# Patient Record
Sex: Female | Born: 2000
Health system: Southern US, Community
[De-identification: ages and names within clinical notes are randomized; demographics above are authoritative.]

## PROBLEM LIST (undated history)

## (undated) DIAGNOSIS — R109 Unspecified abdominal pain: Secondary | ICD-10-CM

## (undated) DIAGNOSIS — K5900 Constipation, unspecified: Secondary | ICD-10-CM

## (undated) HISTORY — PX: FRACTURE SURGERY: SHX138

## (undated) HISTORY — DX: Unspecified abdominal pain: R10.9

## (undated) HISTORY — DX: Constipation, unspecified: K59.00

---
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
## 2022-01-26 ENCOUNTER — Ambulatory Visit: Payer: Self-pay

## 2022-01-26 NOTE — Telephone Encounter (Signed)
  Chief Complaint: depression Symptoms: depression and out of medication for 2 months  Frequency: 2 months or more Pertinent Negatives: Patient denies SI or HI Disposition: [] ED /[x] Urgent Care (no appt availability in office) / [] Appointment(In office/virtual)/ []  [facility]/ [] Home Care/ [] Refused Recommended Disposition /[] [facility]/ []  Follow-up with PCP Additional Notes: pt's mother calling to see what she can do about a referral for [facility] d/t pt not having PCP anymore and pt has increased depression sx since being out of meds for 2 months. She states she has tried several different locations but all needed referrals. Offered [facility] but too far out of the way from pt's location. Recommended mom try [facility] services in [place] and provider phone # and location details. She verbalized understanding and states she will call.   Reason for Disposition  [1] Depression AND [2] worsening (e.g., sleeping poorly, less able to do activities of daily living)  Answer Assessment - Initial Assessment Questions 1. CONCERN: "What happened that made you call today?"     Depressed and not had medicine in 2 months  3. RISK OF HARM - SUICIDAL IDEATION:  "Do you ever have thoughts of hurting or killing yourself?"  (e.g., yes, no, no but preoccupation with thoughts about death)   - INTENT:  "Do you have thoughts of hurting or killing yourself right NOW?" (e.g., yes, no, N/A)   - PLAN: "Do you have a specific plan for how you would do this?" (e.g., gun, knife, overdose, no plan, N/A)     no 4. RISK OF HARM - HOMICIDAL IDEATION:  "Do you ever have thoughts of hurting or killing someone else?"  (e.g., yes, no, no but preoccupation with thoughts about death)   - INTENT:  "Do you have thoughts of hurting or killing someone right NOW?" (e.g., yes, no, N/A)   - PLAN: "Do you have a specific plan for how you would do this?" (e.g., gun, knife, no plan, N/A)      no 5. FUNCTIONAL IMPAIRMENT: "How have  things been going for you overall? Have you had more difficulty than usual doing your normal daily activities?"  (e.g., better, same, worse; self-care, school, work, interactions)     yes 6. SUPPORT: "Who is with you now?" "Who do you live with?" "Do you have family or friends who you can talk to?"      Mother and family  8. STRESSORS: "Has there been any new stress or recent changes in your life?"  Protocols used: Depression-A-AH

## 2022-04-22 NOTE — Progress Notes (Deleted)
New patient visit   Patient: Tammy Reyes   DOB: [birth year]   22 y.o. Female  MRN: [id number] Visit Date: 04/23/2022  Today's healthcare provider: Mikey Kirschner, PA-C   No chief complaint on file.  Subjective    Tammy Reyes is a 22 y.o. female who presents today as a new patient to establish care.  HPI  ***  Past Medical History:  Diagnosis Date   Abdominal pain    Constipation    Past Surgical History:  Procedure Laterality Date   FRACTURE SURGERY Right    2008   Family Status  Relation Name Status   Mother  Alive   Father  Alive   Sister  Alive   Brother  Alive   MGM  Alive   MGF  Alive   PGM  Alive   PGF  Alive   Family History  Problem Relation Age of Onset   Mental illness Mother    Mental illness Maternal Grandmother    Cancer Maternal Grandfather        prostate   Social History   Socioeconomic History   Marital status: Single    Spouse name: Not on file   Number of children: Not on file   Years of education: Not on file   Highest education level: Not on file  Occupational History   Not on file  Tobacco Use   Smoking status: Never   Smokeless tobacco: Never  Vaping Use   Vaping Use: Never used  Substance and Sexual Activity   Alcohol use: No   Drug use: No   Sexual activity: Not Currently  Other Topics Concern   Not on file  Social History Narrative   ** Merged History Encounter **       Social Determinants of Health   Financial Resource Strain: Not on file  Food Insecurity: Not on file  Transportation Needs: Not on file  Physical Activity: Not on file  Stress: Not on file  Social Connections: Not on file   Outpatient Medications Prior to Visit  Medication Sig   FLUoxetine (PROZAC) 20 MG capsule Take 1 pill every other day for 1-2 weeks then increase to 1 tab daily   hydrOXYzine (VISTARIL) 25 MG capsule Take 1 capsule (25 mg total) by mouth every 8 (eight) hours as needed. Do not drive on this medicine   Vitamin D,  Ergocalciferol, (DRISDOL) 1.25 MG (50000 UNIT) CAPS capsule Take 1 tab 2x a week (Monday and Thursday)   No facility-administered medications prior to visit.   No Known Allergies  Immunization History  Administered Date(s) Administered   DTaP 02/05/2003, 04/04/2003, 05/31/2003, 03/06/2004, 11/04/2005   HIB (PRP-OMP) 02/05/2003, 04/04/2003, 05/31/2003, 03/06/2004   HPV Bivalent 11/04/2015   Hepatitis A 11/05/2005, 11/09/2006   Hepatitis B 01/10/03, 09/05/2003, 03/06/2004   IPV 02/05/2003, 04/04/2003, 09/05/2003, 11/14/2007   Influenza,inj,Quad PF,6+ Mos 11/13/2015, 12/04/2018   Influenza-Unspecified 11/07/2014, 12/15/2020   MMR 12/03/2003, 11/14/2007   Meningococcal Conjugate 11/04/2015   Pneumococcal Conjugate-13 02/05/2003, 04/04/2003, 05/31/2003, 12/03/2003   Td 11/14/2007   Tdap 11/04/2015   Varicella 12/03/2003, 11/14/2007    Health Maintenance  Topic Date Due   COVID-19 Vaccine (1) Never done   HPV VACCINES (2 - 2-dose series) 05/05/2016   HIV Screening  Never done   Hepatitis C Screening  Never done   INFLUENZA VACCINE  10/06/2021   DTaP/Tdap/Td (7 - Td or Tdap) 11/03/2025    Patient Care Team: Pcp, No as PCP - General  Review of Systems  {Labs  Heme  Chem  Endocrine  Serology  Results Review (optional):23779}   Objective    There were no vitals taken for this visit. {Show previous vital signs (optional):23777}  Physical Exam ***  Depression Screen    07/22/2021   10:08 AM 04/16/2021    3:19 PM 03/31/2021    8:35 AM 11/06/2020    4:27 PM  PHQ 2/9 Scores  PHQ - 2 Score 5 6 3 5  $ PHQ- 9 Score 17 19 13 21   $ No results found for any visits on 04/23/22.  Assessment & Plan     ***  No follow-ups on file.     {provider attestation***:1}   Mikey Kirschner, PA-C  [place] [phone] (phone) [phone] (fax)  [place]

## 2022-04-23 ENCOUNTER — Ambulatory Visit: Payer: Medicaid Other | Admitting: Physician Assistant

## 2022-05-14 ENCOUNTER — Ambulatory Visit: Payer: Self-pay

## 2022-05-14 ENCOUNTER — Encounter: Payer: Medicaid Other | Admitting: Nurse Practitioner

## 2022-05-14 ENCOUNTER — Telehealth: Payer: Medicaid Other | Admitting: Nurse Practitioner

## 2022-05-14 DIAGNOSIS — F339 Major depressive disorder, recurrent, unspecified: Secondary | ICD-10-CM | POA: Diagnosis not present

## 2022-05-14 MED ORDER — FLUOXETINE HCL 20 MG PO CAPS
ORAL_CAPSULE | ORAL | 2 refills | Status: DC
Start: 2022-05-14 — End: 2022-12-27

## 2022-05-14 NOTE — Progress Notes (Unsigned)
Spoke with Mother, patient is unavailable during time of scheduled video visit. Requested reschedule at 4pm

## 2022-05-14 NOTE — Telephone Encounter (Signed)
  Chief Complaint: depression Symptoms: grieving over broken relationship with spouse, wanting to get back on antidepressants, isolation, sleeping more, eating less Frequency: chronic but was depressed prior to break up and now it is worse Pertinent Negatives: Patient denies suicidal thoughts or homicidal thoughts Disposition: [] ED /[] Urgent Care (no appt availability in office) / [] Appointment(In office/virtual)/ [x]  [facility]/ [] Home Care/ [] Refused Recommended Disposition /[] [facility]/ []  Follow-up with PCP Additional Notes: called [facility] and spoke with Iris who stated pt was dismisses. Made pt a Virtual UC Visit and sent pt resources in her area for beavioral health and suicide crisis line and crisis text line. Advised pt to go to ED if she starts having Suicidal thoughts or plan. Also messaged the address and phone number to [facility].  Reason for Disposition  [1] Depression AND [2] worsening (e.g., sleeping poorly, less able to do activities of daily living)  Answer Assessment - Initial Assessment Questions 1. CONCERN: "What happened that made you call today?"     Spouse  2. DEPRESSION SYMPTOM SCREENING: "How are you feeling overall?" (e.g., decreased energy, increased sleeping or difficulty sleeping, difficulty concentrating, feelings of sadness, guilt, hopelessness, or worthlessness)     Decreased activity, sadness, sleeping a lot , isolating, eating less,  3. RISK OF HARM - SUICIDAL IDEATION:  "Do you ever have thoughts of hurting or killing yourself?"  (e.g., yes, no, no but preoccupation with thoughts about death)   - INTENT:  "Do you have thoughts of hurting or killing yourself right NOW?" (e.g., yes, no, N/A)   - PLAN: "Do you have a specific plan for how you would do this?" (e.g., gun, knife, overdose, no plan, N/A)     no 4. RISK OF HARM - HOMICIDAL IDEATION:  "Do you ever have thoughts of hurting or killing someone else?"  (e.g., yes, no,  no but preoccupation with thoughts about death)   - INTENT:  "Do you have thoughts of hurting or killing someone right NOW?" (e.g., yes, no, N/A)   - PLAN: "Do you have a specific plan for how you would do this?" (e.g., gun, knife, no plan, N/A)      no 5. FUNCTIONAL IMPAIRMENT: "How have things been going for you overall? Have you had more difficulty than usual doing your normal daily activities?"  (e.g., better, same, worse; self-care, school, work, interactions)     Broke up few days ago depression was worse bad about self 6. SUPPORT: "Who is with you now?" "Who do you live with?" "Do you have family or friends who you can talk to?"      Lives with Mom, friends 7. THERAPIST: "Do you have a counselor or therapist? Name?"     No wanting one  8. STRESSORS: "Has there been any new stress or recent changes in your life?"     Breaking up some  9. ALCOHOL USE OR SUBSTANCE USE (DRUG USE): "Do you drink alcohol or use any illegal drugs?"     No  10. OTHER: "Do you have any other physical symptoms right now?" (e.g., fever)       Chest gets tight- comes with stress 11. PREGNANCY: "Is there any chance you are pregnant?" "When was your last menstrual period?"       N/a  Protocols used: Depression-A-AH

## 2022-05-14 NOTE — Patient Instructions (Signed)
New Patient Visit with Tammy Reyes Wednesday June 16, 2022 Arrive by 10:45 AM EDT Starts at 11:00 AM EDT (40 minutes) Add to calendar [facility] [id number] [place] [id number] [phone]

## 2022-05-14 NOTE — Progress Notes (Signed)
Virtual Visit Consent   Tammy Reyes, you are scheduled for a virtual visit with a [facility] provider today. Just as with appointments in the office, your consent must be obtained to participate. Your consent will be active for this visit and any virtual visit you may have with one of our providers in the next 365 days. If you have a MyChart account, a copy of this consent can be sent to you electronically.  As this is a virtual visit, video technology does not allow for your provider to perform a traditional examination. This may limit your provider's ability to fully assess your condition. If your provider identifies any concerns that need to be evaluated in person or the need to arrange testing (such as labs, EKG, etc.), we will make arrangements to do so. Although advances in technology are sophisticated, we cannot ensure that it will always work on either your end or our end. If the connection with a video visit is poor, the visit may have to be switched to a telephone visit. With either a video or telephone visit, we are not always able to ensure that we have a secure connection.  By engaging in this virtual visit, you consent to the provision of healthcare and authorize for your insurance to be billed (if applicable) for the services provided during this visit. Depending on your insurance coverage, you may receive a charge related to this service.  I need to obtain your verbal consent now. Are you willing to proceed with your visit today? Tammy Reyes has provided verbal consent on 05/14/2022 for a virtual visit (video or telephone). Apolonio Schneiders, FNP  Date: 05/14/2022 3:59 PM  Virtual Visit via Video Note   I, Apolonio Schneiders, connected with  Tammy Reyes  ([id number], Oct 12, 2000) on 05/14/22 at  4:00 PM EST by a video-enabled telemedicine application and verified that I am speaking with the correct person using two identifiers.  Location: Patient: Virtual Visit Location Patient:  Home Provider: Virtual Visit Location Provider: Home Office   I discussed the limitations of evaluation and management by telemedicine and the availability of in person appointments. The patient expressed understanding and agreed to proceed.    History of Present Illness: Tammy Reyes is a 22 y.o. who identifies as a female who was assigned female at birth, and is being seen today for depression management.  She has been on Prozac in the past, recently lost PCP and was unable to get refills  She has been off for a few months, last refill was in May   Denies Side effects from medication, felt well managed   She is living at home with her Mom She is working M-F  PHQ9: 3, 3, 3, 3, 3, 3, 3, 3, 0=24 Denies SI/HI  Requesting restart on Prozac due to recurrent depression since being off Prozac       07/22/2021   10:08 AM 04/16/2021    3:19 PM 03/31/2021    8:35 AM  PHQ9 SCORE ONLY  PHQ-9 Total Score '17 19 13     '$ Problems:  Patient Active Problem List   Diagnosis Date Noted   Depression, recurrent ([place]) 08/01/2019   Vitamin D deficiency 12/06/2018   Anemia 11/14/2015   Anxiety and fearfulness of childhood and adolescence 10/23/2015   Allergic rhinitis 05/22/2015    Allergies: No Known Allergies Medications:  Current Outpatient Medications:    FLUoxetine (PROZAC) 20 MG capsule, Take 1 pill every other day for 1-2 weeks then  increase to 1 tab daily, Disp: 30 capsule, Rfl: 3   hydrOXYzine (VISTARIL) 25 MG capsule, Take 1 capsule (25 mg total) by mouth every 8 (eight) hours as needed. Do not drive on this medicine, Disp: 90 capsule, Rfl: 2   Vitamin D, Ergocalciferol, (DRISDOL) 1.25 MG (50000 UNIT) CAPS capsule, Take 1 tab 2x a week (Monday and Thursday), Disp: 24 capsule, Rfl: 1  Observations/Objective: Patient is well-developed, well-nourished in no acute distress.  Resting comfortably  at home.  Head is normocephalic, atraumatic.  No labored breathing.  Speech is clear and  coherent with logical content.  Patient is alert and oriented at baseline.    Assessment and Plan: 1. Depression, recurrent (HCC)  - FLUoxetine (PROZAC) 20 MG capsule; Take 1 pill every other day for 1-2 weeks then increase to 1 tab daily  Dispense: 30 capsule; Refill: 2 Explained we can provide a one time refill while awaiting PCP establishment/FU    Scheduled patient with new PCP in 4 weeks for follow up Instructed to schedule follow up VV or visit Urgent Care if any worsening symptoms or SE from medications or questions  She has help line from nurse call earlier for any SI/HI   Follow Up Instructions: I discussed the assessment and treatment plan with the patient. The patient was provided an opportunity to ask questions and all were answered. The patient agreed with the plan and demonstrated an understanding of the instructions.  A copy of instructions were sent to the patient via MyChart unless otherwise noted below.    The patient was advised to call back or seek an in-person evaluation if the symptoms worsen or if the condition fails to improve as anticipated.  Time:  I spent 15 minutes with the patient via telehealth technology discussing the above problems/concerns.    Apolonio Schneiders, FNP

## 2022-06-16 ENCOUNTER — Ambulatory Visit: Payer: Medicaid Other | Admitting: Nurse Practitioner

## 2022-08-12 ENCOUNTER — Telehealth: Payer: Medicaid Other | Admitting: Physician Assistant

## 2022-08-12 DIAGNOSIS — A084 Viral intestinal infection, unspecified: Secondary | ICD-10-CM | POA: Diagnosis not present

## 2022-08-12 MED ORDER — ONDANSETRON 4 MG PO TBDP: 4.0000 mg | ORAL_TABLET | Freq: Three times a day (TID) | ORAL | 0 refills | Status: DC | PRN

## 2022-08-12 NOTE — Progress Notes (Signed)
Virtual Visit Consent   Tammy Reyes, you are scheduled for a virtual visit with a [facility] provider today. Just as with appointments in the office, your consent must be obtained to participate. Your consent will be active for this visit and any virtual visit you may have with one of our providers in the next 365 days. If you have a MyChart account, a copy of this consent can be sent to you electronically.  As this is a virtual visit, video technology does not allow for your provider to perform a traditional examination. This may limit your provider's ability to fully assess your condition. If your provider identifies any concerns that need to be evaluated in person or the need to arrange testing (such as labs, EKG, etc.), we will make arrangements to do so. Although advances in technology are sophisticated, we cannot ensure that it will always work on either your end or our end. If the connection with a video visit is poor, the visit may have to be switched to a telephone visit. With either a video or telephone visit, we are not always able to ensure that we have a secure connection.  By engaging in this virtual visit, you consent to the provision of healthcare and authorize for your insurance to be billed (if applicable) for the services provided during this visit. Depending on your insurance coverage, you may receive a charge related to this service.  I need to obtain your verbal consent now. Are you willing to proceed with your visit today? Tammy Reyes has provided verbal consent on 08/12/2022 for a virtual visit (video or telephone). Piedad Climes, [place]  Date: 08/12/2022 2:12 PM  Virtual Visit via Video Note   I, Piedad Climes, connected with  Tammy Reyes  ([id number], 06/24/2000) on 08/12/22 at  2:00 PM EDT by a video-enabled telemedicine application and verified that I am speaking with the correct person using two identifiers.  Location: Patient: Virtual Visit Location  Patient: Home Provider: Virtual Visit Location Provider: Home Office   I discussed the limitations of evaluation and management by telemedicine and the availability of in person appointments. The patient expressed understanding and agreed to proceed.    History of Present Illness: Tammy Reyes is a 22 y.o. who identifies as a female who was assigned female at birth, and is being seen today for onset of GI symptoms early this morning. Notes woke up with nausea and non-bloody emesis, shortly followed by loose stool. Denies fever, chills. Denies melena, hematochezia or tenesmus. Notes there is a stomach bug going around at work. Is able to keep hydrated. Was finally able to eat a little bit around lunch.   HPI: HPI  Problems:  Patient Active Problem List   Diagnosis Date Noted   Depression, recurrent (HCC) 08/01/2019   Vitamin D deficiency 12/06/2018   Anemia 11/14/2015   Anxiety and fearfulness of childhood and adolescence 10/23/2015   Allergic rhinitis 05/22/2015    Allergies: No Known Allergies Medications:  Current Outpatient Medications:    ondansetron (ZOFRAN-ODT) 4 MG disintegrating tablet, Take 1 tablet (4 mg total) by mouth every 8 (eight) hours as needed for nausea or vomiting., Disp: 20 tablet, Rfl: 0   Testosterone 1.62 % GEL, SMARTSIG:40.5 Milligram(s) Topical Every Morning, Disp: , Rfl:    FLUoxetine (PROZAC) 20 MG capsule, Take 1 pill every other day for 1-2 weeks then increase to 1 tab daily, Disp: 30 capsule, Rfl: 2  Observations/Objective: Patient is well-developed, well-nourished  in no acute distress.  Resting comfortably at home.  Head is normocephalic, atraumatic.  No labored breathing. Speech is clear and coherent with logical content.  Patient is alert and oriented at baseline.   Assessment and Plan: 1. Viral gastroenteritis - ondansetron (ZOFRAN-ODT) 4 MG disintegrating tablet; Take 1 tablet (4 mg total) by mouth every 8 (eight) hours as needed for nausea  or vomiting.  Dispense: 20 tablet; Refill: 0  No alarm signs or symptoms present. Supportive measures and OTC medications reviewed. Imodium recommended. Start [employer] Handout sent via MyChart. Zofran per orders.   Follow Up Instructions: I discussed the assessment and treatment plan with the patient. The patient was provided an opportunity to ask questions and all were answered. The patient agreed with the plan and demonstrated an understanding of the instructions.  A copy of instructions were sent to the patient via MyChart unless otherwise noted below.   The patient was advised to call back or seek an in-person evaluation if the symptoms worsen or if the condition fails to improve as anticipated.  Time:  I spent 10 minutes with the patient via telehealth technology discussing the above problems/concerns.    Piedad Climes, PA-C

## 2022-08-12 NOTE — Patient Instructions (Signed)
Dion Saucier, thank you for joining Piedad Climes, PA-C for today's virtual visit.  While this provider is not your primary care provider (PCP), if your PCP is located in our provider database this encounter information will be shared with them immediately following your visit.   A [facility] MyChart account gives you access to today's visit and all your visits, tests, and labs performed at [facility] " click here if you don't have a [facility] MyChart account or go to [email or web address]  Consent: (Patient) Tammy Reyes provided verbal consent for this virtual visit at the beginning of the encounter.  Current Medications:  Current Outpatient Medications:    FLUoxetine (PROZAC) 20 MG capsule, Take 1 pill every other day for 1-2 weeks then increase to 1 tab daily, Disp: 30 capsule, Rfl: 2   hydrOXYzine (VISTARIL) 25 MG capsule, Take 1 capsule (25 mg total) by mouth every 8 (eight) hours as needed. Do not drive on this medicine, Disp: 90 capsule, Rfl: 2   Vitamin D, Ergocalciferol, (DRISDOL) 1.25 MG (50000 UNIT) CAPS capsule, Take 1 tab 2x a week (Monday and Thursday), Disp: 24 capsule, Rfl: 1   Medications ordered in this encounter:  No orders of the defined types were placed in this encounter.    *If you need refills on other medications prior to your next appointment, please contact your pharmacy*  Follow-Up: Call back or seek an in-person evaluation if the symptoms worsen or if the condition fails to improve as anticipated.  [facility] [phone]  Other Instructions Viral Gastroenteritis, Adult  Viral gastroenteritis is also known as the stomach flu. This condition may affect your stomach, your small intestine, and your large intestine. It can cause sudden watery poop (diarrhea), fever, and vomiting. This condition is caused by certain germs (viruses). These germs can be passed from person to person very easily (are  contagious). Having watery poop and vomiting can make you feel weak and cause you to not have enough water in your body (get dehydrated). This can make you tired and thirsty, make you have a dry mouth, and make it so you pee (urinate) less often. It is important to replace the fluids that you lose from having watery poop and vomiting. What are the causes? You can get sick by catching germs from other people. You can also get sick by: Eating food, drinking water, or touching a surface that has the germs on it (is contaminated). Sharing utensils or other personal items with a person who is sick. What increases the risk? Having a weak body defense system (immune system). Living with one or more children who are younger than 2 years. Living in a nursing home. Going on cruise ships. What are the signs or symptoms? Symptoms of this condition start suddenly. Symptoms may last for a few days or for as long as a week. Common symptoms include: Watery poop. Vomiting. Other symptoms include: Fever. Headache. Feeling tired (fatigue). Pain in the belly (abdomen). Chills. Feeling weak. Feeling like you may vomit (nauseous). Muscle aches. Not feeling hungry. How is this treated? This condition typically goes away on its own. The focus of treatment is to replace the fluids that you lose. This condition may be treated with: An ORS (oral rehydration solution). This is a drink that helps you replace fluids and minerals your body lost. It is sold at pharmacies and stores. Medicines to help with your symptoms. Probiotic supplements to reduce symptoms of watery poop. Fluids given  through an IV tube, if needed. Older adults and people with other diseases or a weak body defense system are at higher risk for not having enough water in the body. Follow these instructions at home: Eating and drinking  Take an ORS as told by your doctor. Drink clear fluids in small amounts as you are able. Clear fluids  include: Water. Ice chips. Fruit juice that has water added to it (is diluted). Low-calorie sports drinks. Drink enough fluid to keep your pee (urine) pale yellow. Eat small amounts of healthy foods every 3-4 hours as you are able. This may include whole grains, fruits, vegetables, lean meats, and yogurt. Avoid fluids that have a lot of sugar or caffeine in them. This includes energy drinks, sports drinks, and soda. Avoid spicy or fatty foods. Avoid alcohol. General instructions  Wash your hands often. This is very important after you have watery poop or you vomit. If you cannot use soap and water, use hand sanitizer. Make sure that all people in your home wash their hands well and often. Take over-the-counter and prescription medicines only as told by your doctor. Rest at home while you get better. Watch your condition for any changes. Take a warm bath to help with any burning or pain from having watery poop. Keep all follow-up visits. Contact a doctor if: You cannot keep fluids down. Your symptoms get worse. You have new symptoms. You feel light-headed or dizzy. You have muscle cramps. Get help right away if: You have chest pain. You have trouble breathing, or you are breathing very fast. You have a fast heartbeat. You feel very weak or you faint. You have a very bad headache, a stiff neck, or both. You have a rash. You have very bad pain, cramping, or bloating in your belly. Your skin feels cold and clammy. You feel mixed up (confused). You have pain when you pee. You have signs of not having enough water in the body, such as: Dark pee, hardly any pee, or no pee. Cracked lips. Dry mouth. Sunken eyes. Feeling very sleepy. Feeling weak. You have signs of bleeding, such as: You see blood in your vomit. Your vomit looks like coffee grounds. You have bloody or black poop or poop that looks like tar. These symptoms may be an emergency. Get help right away. Call 911. Do  not wait to see if the symptoms will go away. Do not drive yourself to the hospital. Summary Viral gastroenteritis is also known as the stomach flu. This condition can cause sudden watery poop (diarrhea), fever, and vomiting. These germs can be passed from person to person very easily. Take an ORS (oral rehydration solution) as told by your doctor. This is a drink that is sold at pharmacies and stores. Wash your hands often, especially after having watery poop or vomiting. If you cannot use soap and water, use hand sanitizer. This information is not intended to replace advice given to you by your health care provider. Make sure you discuss any questions you have with your health care provider. Document Revised: 12/22/2020 Document Reviewed: 12/22/2020 Elsevier Patient Education  2024 Elsevier Inc.    If you have been instructed to have an in-person evaluation today at a local Urgent Care facility, please use the link below. It will take you to a list of all of our available [facility], including address, phone number and hours of operation. Please do not delay care.  [facility]  If you or a  family member do not have a primary care provider, use the link below to schedule a visit and establish care. When you choose a [facility] primary care physician or advanced practice provider, you gain a long-term partner in health. Find a Primary Care Provider  Learn more about [facility] in-office and virtual care options: [facility] - Get Care Now

## 2022-11-23 ENCOUNTER — Ambulatory Visit (HOSPITAL_BASED_OUTPATIENT_CLINIC_OR_DEPARTMENT_OTHER): Payer: Medicaid Other | Admitting: Family Medicine

## 2022-12-27 ENCOUNTER — Encounter: Payer: Self-pay | Admitting: Cardiology

## 2022-12-27 ENCOUNTER — Ambulatory Visit: Payer: Medicaid Other | Admitting: Cardiology

## 2022-12-27 VITALS — BP 110/65 | HR 103 | Ht 61.0 in | Wt 186.2 lb

## 2022-12-27 DIAGNOSIS — F938 Other childhood emotional disorders: Secondary | ICD-10-CM

## 2022-12-27 DIAGNOSIS — F339 Major depressive disorder, recurrent, unspecified: Secondary | ICD-10-CM

## 2022-12-27 DIAGNOSIS — Z7689 Persons encountering health services in other specified circumstances: Secondary | ICD-10-CM | POA: Diagnosis not present

## 2022-12-27 DIAGNOSIS — Z0130 Encounter for examination of blood pressure without abnormal findings: Secondary | ICD-10-CM

## 2022-12-27 MED ORDER — VENLAFAXINE HCL ER 37.5 MG PO CP24: 37.5000 mg | ORAL_CAPSULE | Freq: Every day | ORAL | 2 refills | Status: DC

## 2022-12-27 NOTE — Progress Notes (Signed)
New Patient Office Visit  Subjective    Patient ID: Tammy Reyes, female    DOB: 19-Jul-2000  Age: 22 y.o. MRN: [id number]  CC:  Chief Complaint  Patient presents with   Establish Care    HPI MARTICA OTTS presents to establish care. Patient has a history of anxiety and depression. Has been off of Prozac for about one year. Interested in starting Effexor today. Patient also interested in seeing a counselor. Referral sent.    Outpatient Encounter Medications as of 12/27/2022  Medication Sig   venlafaxine XR (EFFEXOR XR) 37.5 MG 24 hr capsule Take 1 capsule (37.5 mg total) by mouth daily.   Testosterone 1.62 % GEL SMARTSIG:40.5 Milligram(s) Topical Every Morning   [DISCONTINUED] FLUoxetine (PROZAC) 20 MG capsule Take 1 pill every other day for 1-2 weeks then increase to 1 tab daily (Patient taking differently: Take 20 mg by mouth daily. Take 1 pill every other day for 1-2 weeks then increase to 1 tab daily)   [DISCONTINUED] ondansetron (ZOFRAN-ODT) 4 MG disintegrating tablet Take 1 tablet (4 mg total) by mouth every 8 (eight) hours as needed for nausea or vomiting.   No facility-administered encounter medications on file as of 12/27/2022.    Past Medical History:  Diagnosis Date   Abdominal pain    Constipation     Past Surgical History:  Procedure Laterality Date   FRACTURE SURGERY Right    2008    Family History  Problem Relation Age of Onset   Mental illness Mother    Mental illness Maternal Grandmother    Cancer Maternal Grandfather        prostate    Social History   Socioeconomic History   Marital status: Single    Spouse name: Not on file   Number of children: Not on file   Years of education: Not on file   Highest education level: Not on file  Occupational History   Not on file  Tobacco Use   Smoking status: Never   Smokeless tobacco: Never  Vaping Use   Vaping status: Never Used  Substance and Sexual Activity   Alcohol use: No   Drug use: No    Sexual activity: Not Currently  Other Topics Concern   Not on file  Social History Narrative   ** Merged History Encounter **       Social Determinants of Health   Financial Resource Strain: Not on file  Food Insecurity: Not on file  Transportation Needs: Not on file  Physical Activity: Not on file  Stress: Not on file  Social Connections: Not on file  Intimate Partner Violence: Not on file    Review of Systems  Constitutional: Negative.   HENT: Negative.    Eyes: Negative.   Respiratory: Negative.  Negative for shortness of breath.   Cardiovascular: Negative.  Negative for chest pain.  Gastrointestinal: Negative.  Negative for abdominal pain, constipation and diarrhea.  Genitourinary: Negative.   Musculoskeletal:  Negative for joint pain and myalgias.  Skin: Negative.   Neurological: Negative.  Negative for dizziness and headaches.  Endo/Heme/Allergies: Negative.   All other systems reviewed and are negative.       Objective    BP 110/65   Pulse (!) 103   Ht 5\' 1"  (1.549 m)   Wt 186 lb 3.2 oz (84.5 kg)   SpO2 98%   BMI 35.18 kg/m   Physical Exam Vitals and nursing note reviewed.  Constitutional:  Appearance: Normal appearance. She is normal weight.  HENT:     Head: Normocephalic and atraumatic.     Nose: Nose normal.     Mouth/Throat:     Mouth: Mucous membranes are moist.  Eyes:     Extraocular Movements: Extraocular movements intact.     Conjunctiva/sclera: Conjunctivae normal.     Pupils: Pupils are equal, round, and reactive to light.  Cardiovascular:     Rate and Rhythm: Normal rate and regular rhythm.     Pulses: Normal pulses.     Heart sounds: Normal heart sounds.  Pulmonary:     Effort: Pulmonary effort is normal.     Breath sounds: Normal breath sounds.  Abdominal:     General: Abdomen is flat. Bowel sounds are normal.     Palpations: Abdomen is soft.  Musculoskeletal:        General: Normal range of motion.     Cervical back:  Normal range of motion.  Skin:    General: Skin is warm and dry.  Neurological:     General: No focal deficit present.     Mental Status: She is alert and oriented to person, place, and time.  Psychiatric:        Mood and Affect: Mood normal.        Behavior: Behavior normal.        Thought Content: Thought content normal.        Judgment: Judgment normal.       12/27/2022    2:10 PM 07/22/2021   10:08 AM 04/16/2021    3:20 PM 03/31/2021    8:35 AM  GAD 7 : Generalized Anxiety Score  Nervous, Anxious, on Edge 3 3 3 3   Control/stop worrying 3 3 3 3   Worry too much - different things 3 3 3 3   Trouble relaxing 3 3 3 2   Restless 3 0 0 1  Easily annoyed or irritable 3 3 2 3   Afraid - awful might happen 3 1 3 3   Total GAD 7 Score 21 16 17 18   Anxiety Difficulty  Somewhat difficult Somewhat difficult Somewhat difficult    Flowsheet Row Office Visit from 12/27/2022 in [facility] Office Visit from 07/22/2021 in [facility] Office Visit from 04/16/2021 in [facility]  Thoughts that you would be better off dead, or of hurting yourself in some way Not at all Several days More than half the days  PHQ-9 Total Score 21 17 19           Assessment & Plan:  Effexor sent to the pharmacy.  Referral sent to psychiatry.   Problem List Items Addressed This Visit       Other   Anxiety and fearfulness of childhood and adolescence   Relevant Orders   Ambulatory referral to Psychiatry   Depression, recurrent (HCC)   Relevant Medications   venlafaxine XR (EFFEXOR XR) 37.5 MG 24 hr capsule   Other Relevant Orders   Ambulatory referral to Psychiatry   Encounter to establish care - Primary    Return in about 4 weeks (around 01/24/2023).   Marisue Ivan, NP

## 2023-01-25 ENCOUNTER — Ambulatory Visit: Payer: Medicaid Other | Admitting: Cardiology

## 2023-01-27 ENCOUNTER — Encounter (HOSPITAL_BASED_OUTPATIENT_CLINIC_OR_DEPARTMENT_OTHER): Payer: Self-pay | Admitting: Family Medicine

## 2023-02-18 ENCOUNTER — Encounter: Payer: Self-pay | Admitting: Cardiology

## 2023-02-18 ENCOUNTER — Ambulatory Visit: Payer: Medicaid Other | Admitting: Cardiology

## 2023-02-18 VITALS — BP 110/78 | HR 91 | Ht 61.0 in | Wt 178.4 lb

## 2023-02-18 DIAGNOSIS — F938 Other childhood emotional disorders: Secondary | ICD-10-CM | POA: Diagnosis not present

## 2023-02-18 DIAGNOSIS — F339 Major depressive disorder, recurrent, unspecified: Secondary | ICD-10-CM | POA: Diagnosis not present

## 2023-02-18 MED ORDER — VENLAFAXINE HCL ER 75 MG PO CP24: 75.0000 mg | ORAL_CAPSULE | Freq: Every day | ORAL | 2 refills | Status: DC

## 2023-02-18 NOTE — Progress Notes (Signed)
Established Patient Office Visit  Subjective:  Patient ID: Tammy Reyes, female    DOB: 03-10-2000  Age: 22 y.o. MRN: [id number]  Chief Complaint  Patient presents with   Follow-up    4 week follow up    Patient in office for 4 week follow up. Patient started on Effexor at previous visit. States she is feeling better, however, she feels the medication wears off early. Will increase dose to 75 mg daily. Patient has not heard from psychiatry. Will giver her the number today to call.     No other concerns at this time.   Past Medical History:  Diagnosis Date   Abdominal pain    Constipation     Past Surgical History:  Procedure Laterality Date   FRACTURE SURGERY Right    2008    Social History   Socioeconomic History   Marital status: Single    Spouse name: Not on file   Number of children: Not on file   Years of education: Not on file   Highest education level: Not on file  Occupational History   Not on file  Tobacco Use   Smoking status: Never   Smokeless tobacco: Never  Vaping Use   Vaping status: Never Used  Substance and Sexual Activity   Alcohol use: No   Drug use: No   Sexual activity: Not Currently  Other Topics Concern   Not on file  Social History Narrative   ** Merged History Encounter **       Social Drivers of Corporate investment banker Strain: Not on file  Food Insecurity: Not on file  Transportation Needs: Not on file  Physical Activity: Not on file  Stress: Not on file  Social Connections: Not on file  Intimate Partner Violence: Not on file    Family History  Problem Relation Age of Onset   Mental illness Mother    Mental illness Maternal Grandmother    Cancer Maternal Grandfather        prostate    No Known Allergies  Outpatient Medications Prior to Visit  Medication Sig   Testosterone 1.62 % GEL SMARTSIG:40.5 Milligram(s) Topical Every Morning   [DISCONTINUED] venlafaxine XR (EFFEXOR XR) 37.5 MG 24 hr capsule Take 1  capsule (37.5 mg total) by mouth daily.   No facility-administered medications prior to visit.    Review of Systems  Constitutional: Negative.   HENT: Negative.    Eyes: Negative.   Respiratory: Negative.  Negative for shortness of breath.   Cardiovascular: Negative.  Negative for chest pain.  Gastrointestinal: Negative.  Negative for abdominal pain, constipation and diarrhea.  Genitourinary: Negative.   Musculoskeletal:  Negative for joint pain and myalgias.  Skin: Negative.   Neurological: Negative.  Negative for dizziness and headaches.  Endo/Heme/Allergies: Negative.   Psychiatric/Behavioral:  Positive for depression. The patient is nervous/anxious.   All other systems reviewed and are negative.      Objective:   BP 110/78   Pulse 91   Ht 5\' 1"  (1.549 m)   Wt 178 lb 6.4 oz (80.9 kg)   SpO2 98%   BMI 33.71 kg/m   Vitals:   02/18/23 1538  BP: 110/78  Pulse: 91  Height: 5\' 1"  (1.549 m)  Weight: 178 lb 6.4 oz (80.9 kg)  SpO2: 98%  BMI (Calculated): 33.73    Physical Exam Vitals and nursing note reviewed.  Constitutional:      Appearance: Normal appearance. She is normal weight.  HENT:  Head: Normocephalic and atraumatic.     Nose: Nose normal.     Mouth/Throat:     Mouth: Mucous membranes are moist.  Eyes:     Extraocular Movements: Extraocular movements intact.     Conjunctiva/sclera: Conjunctivae normal.     Pupils: Pupils are equal, round, and reactive to light.  Cardiovascular:     Rate and Rhythm: Normal rate and regular rhythm.     Pulses: Normal pulses.     Heart sounds: Normal heart sounds.  Pulmonary:     Effort: Pulmonary effort is normal.     Breath sounds: Normal breath sounds.  Abdominal:     General: Abdomen is flat. Bowel sounds are normal.     Palpations: Abdomen is soft.  Musculoskeletal:        General: Normal range of motion.     Cervical back: Normal range of motion.  Skin:    General: Skin is warm and dry.  Neurological:      General: No focal deficit present.     Mental Status: She is alert and oriented to person, place, and time.  Psychiatric:        Mood and Affect: Mood normal.        Behavior: Behavior normal.        Thought Content: Thought content normal.        Judgment: Judgment normal.      No results found for any visits on 02/18/23.  No results found for this or any previous visit (from the past 2160 hours).    Assessment & Plan:  Increase Effexor to 75 mg daily. Call psychiatry to make an appointment.   Problem List Items Addressed This Visit       Other   Anxiety and fearfulness of childhood and adolescence   Depression, recurrent (HCC) - Primary   Relevant Medications   venlafaxine XR (EFFEXOR XR) 75 MG 24 hr capsule    Return in about 4 weeks (around 03/18/2023).   Total time spent: 25 minutes  [employer], NP  02/18/2023   This document may have been prepared by Dragon Voice Recognition software and as such may include unintentional dictation errors.

## 2023-03-07 ENCOUNTER — Other Ambulatory Visit: Payer: Self-pay | Admitting: Cardiology

## 2023-03-25 ENCOUNTER — Ambulatory Visit: Payer: Medicaid Other | Admitting: Cardiology

## 2024-01-12 ENCOUNTER — Other Ambulatory Visit: Payer: Self-pay | Admitting: Cardiology

## 2024-02-10 ENCOUNTER — Ambulatory Visit: Admitting: Cardiology

## 2024-02-28 ENCOUNTER — Other Ambulatory Visit: Payer: Self-pay | Admitting: Cardiology

## 2024-04-06 ENCOUNTER — Encounter: Payer: Self-pay | Admitting: Cardiology

## 2024-04-06 ENCOUNTER — Ambulatory Visit: Admitting: Cardiology

## 2024-04-06 VITALS — BP 120/82 | HR 66 | Ht 62.0 in | Wt 161.0 lb

## 2024-04-06 DIAGNOSIS — F938 Other childhood emotional disorders: Secondary | ICD-10-CM | POA: Diagnosis not present

## 2024-04-06 DIAGNOSIS — F339 Major depressive disorder, recurrent, unspecified: Secondary | ICD-10-CM | POA: Diagnosis not present

## 2024-04-06 DIAGNOSIS — Z0130 Encounter for examination of blood pressure without abnormal findings: Secondary | ICD-10-CM

## 2024-04-06 MED ORDER — VENLAFAXINE HCL ER 37.5 MG PO CP24: 37.5000 mg | ORAL_CAPSULE | Freq: Every day | ORAL | 2 refills | Status: AC

## 2024-04-06 NOTE — Progress Notes (Signed)
 "  Established Patient Office Visit  Subjective:  Patient ID: Tammy Reyes, female    DOB: 2000/11/12  Age: 24 y.o. MRN: [id number]  Chief Complaint  Patient presents with   Acute Visit    Stopped taking med and then started back and is now nauseous, dizzy, lightheaded, not able to eat , feels weak, and dry heaving.  More anxious, and more paranoid since starting back the medications. Pt stated she didn't have these symptoms the first time she was on the medication        Patient in office for an acute visit, patient complaining of weakness. Patient had stopped her Effexor  75 mg on her own. She reports missing multiple doses before stopping it completely. Was off of it for about 4 weeks. Patient decided to restart the Effexor  at the 75 mg dose. Patient is now experiencing weakness, nausea and vomiting. Will send in low dose of Effexor , 37.5 mg to take for about 4 weeks, then weill increase back to 75 mg daily.  Patient requesting a new referral to psychiatry. Referral sent.     No other concerns at this time.   Past Medical History:  Diagnosis Date   Abdominal pain    Constipation     Past Surgical History:  Procedure Laterality Date   FRACTURE SURGERY Right    2008    Social History   Socioeconomic History   Marital status: Single    Spouse name: Not on file   Number of children: Not on file   Years of education: Not on file   Highest education level: Not on file  Occupational History   Not on file  Tobacco Use   Smoking status: Never   Smokeless tobacco: Never  Vaping Use   Vaping status: Never Used  Substance and Sexual Activity   Alcohol use: No   Drug use: No   Sexual activity: Not Currently  Other Topics Concern   Not on file  Social History Narrative   ** Merged History Encounter **       Social Drivers of Health   Tobacco Use: Low Risk (04/06/2024)   Patient History    Smoking Tobacco Use: Never    Smokeless Tobacco Use: Never    Passive  Exposure: Not on file  Financial Resource Strain: Not on file  Food Insecurity: Not on file  Transportation Needs: Not on file  Physical Activity: Not on file  Stress: Not on file  Social Connections: Not on file  Intimate Partner Violence: Not on file  Depression (PHQ2-9): High Risk (12/27/2022)   Depression (PHQ2-9)    PHQ-2 Score: 21  Alcohol Screen: Not on file  Housing: Not on file  Utilities: Not on file  Health Literacy: Not on file    Family History  Problem Relation Age of Onset   Mental illness Mother    Mental illness Maternal Grandmother    Cancer Maternal Grandfather        prostate    Allergies[1]  Show/hide medication list[2]  Review of Systems  Constitutional: Negative.   HENT: Negative.    Eyes: Negative.   Respiratory: Negative.  Negative for shortness of breath.   Cardiovascular: Negative.  Negative for chest pain.  Gastrointestinal:  Positive for nausea and vomiting. Negative for abdominal pain, constipation and diarrhea.  Genitourinary: Negative.   Musculoskeletal:  Negative for joint pain and myalgias.  Skin: Negative.   Neurological: Negative.  Negative for dizziness and headaches.  Endo/Heme/Allergies: Negative.  All other systems reviewed and are negative.      Objective:   BP 120/82   Pulse 66   Ht 5' 2 (1.575 m)   Wt 161 lb (73 kg)   SpO2 96%   BMI 29.45 kg/m   Vitals:   04/06/24 1427  BP: 120/82  Pulse: 66  Height: 5' 2 (1.575 m)  Weight: 161 lb (73 kg)  SpO2: 96%  BMI (Calculated): 29.44    Physical Exam Vitals and nursing note reviewed.  Constitutional:      Appearance: Normal appearance. She is normal weight.  HENT:     Head: Normocephalic and atraumatic.     Nose: Nose normal.     Mouth/Throat:     Mouth: Mucous membranes are moist.  Eyes:     Extraocular Movements: Extraocular movements intact.     Conjunctiva/sclera: Conjunctivae normal.     Pupils: Pupils are equal, round, and reactive to light.   Cardiovascular:     Rate and Rhythm: Normal rate and regular rhythm.     Pulses: Normal pulses.     Heart sounds: Normal heart sounds.  Pulmonary:     Effort: Pulmonary effort is normal.     Breath sounds: Normal breath sounds.  Abdominal:     General: Abdomen is flat. Bowel sounds are normal.     Palpations: Abdomen is soft.  Musculoskeletal:        General: Normal range of motion.     Cervical back: Normal range of motion.  Skin:    General: Skin is warm and dry.  Neurological:     General: No focal deficit present.     Mental Status: She is alert and oriented to person, place, and time.  Psychiatric:        Mood and Affect: Mood normal.        Behavior: Behavior normal.        Thought Content: Thought content normal.        Judgment: Judgment normal.      No results found for any visits on 04/06/24.  No results found for this or any previous visit (from the past 2160 hours).    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   Total time spent: 25 minutes. This time includes review of previous notes and results and patient face to face interaction during today's visit.    Jeoffrey Pollen, NP  04/06/2024   This document may have been prepared by Dragon Voice Recognition software and as such may include unintentional dictation errors.     [1] No Known Allergies [2]  Outpatient Medications Prior to Visit  Medication Sig   [DISCONTINUED] venlafaxine  XR (EFFEXOR -XR) 75 MG 24 hr capsule TAKE 1 CAPSULE(75 MG) BY MOUTH DAILY   Testosterone 1.62 % GEL  SMARTSIG:40.5 Milligram(s) Topical Every Morning (Patient not taking: Reported on 04/06/2024)   No facility-administered medications prior to visit.   "

## 2024-05-04 ENCOUNTER — Ambulatory Visit: Admitting: Cardiology
